# Patient Record
Sex: Female | Born: 1945 | Race: White | Hispanic: No | Marital: Married | Smoking: Never smoker
Health system: Southern US, Community
[De-identification: ages and names within clinical notes are randomized; demographics above are authoritative.]

## PROBLEM LIST (undated history)

## (undated) DIAGNOSIS — Z8719 Personal history of other diseases of the digestive system: Secondary | ICD-10-CM

## (undated) DIAGNOSIS — E7800 Pure hypercholesterolemia, unspecified: Secondary | ICD-10-CM

## (undated) DIAGNOSIS — I10 Essential (primary) hypertension: Secondary | ICD-10-CM

## (undated) DIAGNOSIS — I341 Nonrheumatic mitral (valve) prolapse: Secondary | ICD-10-CM

## (undated) DIAGNOSIS — M797 Fibromyalgia: Secondary | ICD-10-CM

## (undated) DIAGNOSIS — R112 Nausea with vomiting, unspecified: Secondary | ICD-10-CM

## (undated) DIAGNOSIS — G971 Other reaction to spinal and lumbar puncture: Secondary | ICD-10-CM

## (undated) DIAGNOSIS — K219 Gastro-esophageal reflux disease without esophagitis: Secondary | ICD-10-CM

## (undated) DIAGNOSIS — M1990 Unspecified osteoarthritis, unspecified site: Secondary | ICD-10-CM

## (undated) DIAGNOSIS — Z9889 Other specified postprocedural states: Secondary | ICD-10-CM

## (undated) HISTORY — PX: CHOLECYSTECTOMY: SHX55

## (undated) HISTORY — PX: BUNIONECTOMY: SHX129

## (undated) HISTORY — PX: ABDOMINAL HYSTERECTOMY: SHX81

## (undated) HISTORY — PX: TONSILLECTOMY: SUR1361

---

## 2011-10-22 ENCOUNTER — Other Ambulatory Visit: Payer: Self-pay | Admitting: Neurosurgery

## 2011-11-12 ENCOUNTER — Encounter (HOSPITAL_COMMUNITY): Payer: Self-pay | Admitting: Pharmacy Technician

## 2011-11-19 ENCOUNTER — Encounter (HOSPITAL_COMMUNITY): Payer: Self-pay

## 2011-11-19 ENCOUNTER — Encounter (HOSPITAL_COMMUNITY)
Admission: RE | Admit: 2011-11-19 | Discharge: 2011-11-19 | Disposition: A | Discharge status: Home / Self Care | Payer: Medicare Other | Source: Ambulatory Visit | Attending: Neurosurgery | Admitting: Neurosurgery

## 2011-11-19 HISTORY — DX: Other specified postprocedural states: Z98.890

## 2011-11-19 HISTORY — DX: Personal history of other diseases of the digestive system: Z87.19

## 2011-11-19 HISTORY — DX: Nausea with vomiting, unspecified: R11.2

## 2011-11-19 HISTORY — DX: Gastro-esophageal reflux disease without esophagitis: K21.9

## 2011-11-19 HISTORY — DX: Essential (primary) hypertension: I10

## 2011-11-19 HISTORY — DX: Pure hypercholesterolemia, unspecified: E78.00

## 2011-11-19 HISTORY — DX: Unspecified osteoarthritis, unspecified site: M19.90

## 2011-11-19 HISTORY — DX: Fibromyalgia: M79.7

## 2011-11-19 HISTORY — DX: Nonrheumatic mitral (valve) prolapse: I34.1

## 2011-11-19 HISTORY — DX: Other reaction to spinal and lumbar puncture: G97.1

## 2011-11-19 LAB — SURGICAL PCR SCREEN: Staphylococcus aureus: NEGATIVE

## 2011-11-19 LAB — CBC
HCT: 39.1 % (ref 36.0–46.0)
Hemoglobin: 12.9 g/dL (ref 12.0–15.0)
MCV: 92.7 fL (ref 78.0–100.0)
RDW: 13.2 % (ref 11.5–15.5)
WBC: 6.2 10*3/uL (ref 4.0–10.5)

## 2011-11-19 LAB — BASIC METABOLIC PANEL
CO2: 25 mEq/L (ref 19–32)
Chloride: 102 mEq/L (ref 96–112)
Creatinine, Ser: 1.2 mg/dL — ABNORMAL HIGH (ref 0.50–1.10)
Potassium: 3.3 mEq/L — ABNORMAL LOW (ref 3.5–5.1)

## 2011-11-19 MED ORDER — CEFAZOLIN SODIUM-DEXTROSE 2-3 GM-% IV SOLR
2.0000 g | INTRAVENOUS | Status: AC
  Administered 2011-11-20: 2 g via INTRAVENOUS
  Filled 2011-11-19 (×2): qty 50

## 2011-11-19 NOTE — H&P (Signed)
NEUROSURGICAL CONSULTATION   Sherri Graves   DOB:  Dec 22, 1945 #[id number]    October 22, 2011   HISTORY:     Sherri Graves is a 66 year old retired woman with a chief complaint of neck and shoulder pain as well as low back pain. She says her back has been bothering her since April of 2012, but her neck has been bothering her for years.  She describes her neck pain has 8 to 9/10 in severity. She says laying down decreases her pain. She has been to Dr. Neale Burly who did injections to help her with headache and neckache and this has given her some relief for about eight weeks.  Her headaches have resolved both on Topamax and with injections, but her neck pain is absolutely no better.  She has had epidural steroid injections which she said helped her for about a week.  She notes that she is ALLERGIC NARCOTICS AND SAYS THESE WORSEN HER MIGRAINES.  She currently is complaining of neck pain, pain into her shoulders and biceps, and she says both of her arms go numb at night and she describes the pain as constant.    REVIEW OF SYSTEMS:   A detailed Review of Systems sheet was reviewed with the patient.  Pertinent positives include under ears, nose, mouth, and throat - she notes balance disturbance, inability to smell, under cardiovascular - she notes high blood pressure, high cholesterol, swelling in her feet and hands, under musculoskeletal - she notes arm weakness, back pain, joint pain and swelling, rheumatoid arthritis and neck pain, under psychiatric - she notes depression.  All other systems are negative; this includes Constitutional symptoms, Eyes, Endocrine, Respiratory, Gastrointestinal, Genitourinary, Integumentary & Breast, Neurologic, Hematologic/Lymphatic, Allergic/Immunologic.    PAST MEDICAL HISTORY:      Current Medical Conditions:    Her medical problems are extensive and include hypertension, rheumatoid arthritis, hiatal hernia, migraines, mitral valve prolapse, elevated cholesterol,  fibromyalgia and depression.      Prior Operations and Hospitalizations:   Significant for a hysterectomy in 1993, cholecystectomy in 2008, cataract surgery with lens implants in 04/2010 and 05/2010.      Medications and Allergies:  She takes 23 medications - Bystolic 10 mg. qam, Hydrochlorothiazide 12.5 mg. qam, Estradiol 1 mg. qam, Protonix 40 mg. qam, Crestor 10 mg. qpm, Singulair 10 mg. qpm, Methotrexate 2.5 mg. eight per week, Gabapentin 300 mg. two qpm, Viibryd 40 mg. qam, Savella 25 mg. one qam and one qpm, Milnacipran 40 mg. every other week, Dephin (?sp) 15 mg. qam, Trazodone 150 mg. qpm, Topamax 25 mg. three qpm, Baclofen 10 mg. prn, Chlorpromazine 25 mg. prn, Flector patches 1.3% prn, Phenergan 25 mg. prn, Aspirin 81 mg. qpm, Lysine two qd, Calcium 600 + D two qd, Acidophilus 14 mg. qam, and Vitamin D3 two qd.  ALLERGIES TO MEDICATIONS INCLUDE BIAXIN, NARCOTICS, AND NORVASC WHICH CAUSES SWELLING.      Height and Weight:     She is 5'2" tall, 192 lbs.    FAMILY HISTORY:    Both parents are deceased.  Family history of heart attack, stroke, and high blood pressure.    SOCIAL HISTORY:    She currently is a nonsmoker, nondrinker, and no history of substance abuse.    DIAGNOSTIC STUDIES:   We obtained cervical spine radiographs in the office today which show spurring at C5-6 and C6-7 levels with foraminal stenosis at C5-6 and C6-7 levels.  Her C1-2 articulation appears normal with no evidence of malalignment on flexion and extension.  An MRI was performed at [facility] in [place], [place].  This demonstrates at C5-6 there is osteophytes with severe biforaminal stenosis and moderate to severe spinal cord compression with myelopathy and at C6-7 there are osteophytes with severe biforaminal stenosis.    The patient underwent evaluation at the [facility] and said she did not like it and did not want to go back there.    PHYSICAL  EXAMINATION:      General Appearance:   On examination today, [name] is a pleasant and cooperative woman in no acute distress.     Blood Pressure, Pulse:     Her blood pressure is 122/78.  Heart rate is 74 and regular. Respiratory rate is 18.      HEENT - normocephalic, atraumatic.  The pupils are equal, round and reactive to light.  The extraocular muscles are intact.  Sclerae - white.  Conjunctiva - pink.  Oropharynx benign.  Uvula midline.     Neck - there are no masses, meningismus, deformities, tracheal deviation, jugular vein distention or carotid bruits.  There is normal cervical range of motion. She has positive parascapular tenderness bilaterally and a positive Spurlings' maneuver to both sides, with   Lhermitte's sign with axial compression.      Respiratory - there is normal respiratory effort with good intercostal function.  Lungs are clear to auscultation.  There are no rales, rhonchi or wheezes.      Cardiovascular - the heart has regular rate and rhythm to auscultation.  No murmurs are appreciated.  There is no extremity edema, cyanosis or clubbing.  There are palpable pedal pulses.      Abdomen - soft, nontender, no hepatosplenomegaly appreciated or masses.  There are active bowel sounds.  No guarding or rebound.      Musculoskeletal Examination - she has negative Tinel's signs and Phalen's signs at the wrists.  She has some mild tenderness over her anterior shoulder.    NEUROLOGICAL EXAMINATION: The patient is oriented to time, person and place and has good recall of both recent and remote memory with normal attention span and concentration.  The patient speaks with clear and fluent speech and exhibits normal language function and appropriate fund of knowledge.      Cranial Nerve Examination - pupils are equal, round and reactive to light.  Extraocular movements are full.  Visual fields are full to confrontational testing.  Facial sensation and facial movement are  symmetric and intact.  Hearing is intact to finger rub.  Palate is upgoing.  Shoulder shrug is symmetric.  Tongue protrudes in the midline.      Motor Examination - motor strength is 5/5 in the bilateral deltoids, triceps, handgrips, interosseous, 4/5 bilateral biceps and 4/5 bilateral wrist extension.  In the lower extremities motor strength is 5/5 in hip flexion, extension, quadriceps, hamstrings, plantar flexion, dorsiflexion and extensor hallucis longus.      Sensory Examination - normal to light touch and pinprick sensation in the upper and lower extremities.     Deep Tendon Reflexes - 1 in the biceps, 2 in the triceps, 2 in the knees, 2 in the ankles.  The great toes are downgoing to plantar stimulation.      Cerebellar Examination - normal coordination in upper and lower extremities and normal rapid alternating movements.  Romberg test is negative.    IMPRESSION AND RECOMMENDATIONS: Shereen Marton is a 66 year old woman with significant cervical spondylosis and canal stenosis with bilateral upper extremity  weakness. She also has low back pain.  She has a history of rheumatoid arthritis.  I have advised her based on her significant symptoms and degree of canal compromise, and nerve root compression with significant symptomatology that she undergo anterior cervical decompression and fusion at the C5-6 and C6-7 levels. We have set this up for 11/20/2011. She will wear a soft collar after surgery. She wishes to proceed.  She has family in [place]. She comes from [facility].  Risks and benefits were discussed in detail with the patient.  I reviewed her radiographs and met with her and her husband for greater than 45 minutes.  She was fitted for a soft cervical collar by Georgiann Cocker, my Registered Nurse, who also spent time discussing surgery and going over patient education with the patient.    [facility]    Danae Orleans. Venetia Maxon, M.D.  JDS:aft cc: Dr.  Santiago Glad   Dr. Roselie Awkward and Ardell Isaacs, FNP, [facility], [address], [place], [place]  [address]

## 2011-11-19 NOTE — Pre-Procedure Instructions (Signed)
20 Sherri Graves  11/19/2011   Your procedure is scheduled on:  Tuesday July 16 @ 7:30 AM  Report to [facility] at 5:30 AM.  Call this number if you have problems the morning of surgery: [phone]   Remember:   Do not eat food:After Midnight.  May have clear liquids:until Midnight .  Clear liquids include soda, tea, black coffee, apple or grape juice, broth.  Take these medicines the morning of surgery with A SIP OF WATER: Savella, Bystolic, Protonix, Vilazodone.  Baclofen if needed    Do not wear jewelry, make-up or nail polish.  Do not wear lotions, powders, or perfumes. You may wear deodorant.  Do not shave 48 hours prior to surgery. Men may shave face and neck.  Do not bring valuables to the hospital.  Contacts, dentures or bridgework may not be worn into surgery.  Leave suitcase in the car. After surgery it may be brought to your room.  For patients admitted to the hospital, checkout time is 11:00 AM the day of discharge.   Patients discharged the day of surgery will not be allowed to drive home.  Name and phone number of your driver: NA  Special Instructions: CHG Shower Use Special Wash: 1/2 bottle night before surgery and 1/2 bottle morning of surgery.   Please read over the following fact sheets that you were given: Pain Booklet, Coughing and Deep Breathing and Surgical Site Infection Prevention

## 2011-11-19 NOTE — Progress Notes (Signed)
Requested EKG from pt's PCP Dr Wyatt Haste at [facility] in [place], [place].

## 2011-11-20 ENCOUNTER — Encounter (HOSPITAL_COMMUNITY): Payer: Self-pay | Admitting: Certified Registered"

## 2011-11-20 ENCOUNTER — Encounter (HOSPITAL_COMMUNITY)
Admission: RE | Disposition: A | Discharge status: Home / Self Care | Payer: Self-pay | Source: Ambulatory Visit | Attending: Neurosurgery

## 2011-11-20 ENCOUNTER — Inpatient Hospital Stay (HOSPITAL_COMMUNITY): Payer: Medicare Other

## 2011-11-20 ENCOUNTER — Inpatient Hospital Stay (HOSPITAL_COMMUNITY): Payer: Medicare Other | Admitting: Certified Registered"

## 2011-11-20 ENCOUNTER — Inpatient Hospital Stay (HOSPITAL_COMMUNITY)
Admission: RE | Admit: 2011-11-20 | Discharge: 2011-11-21 | DRG: 472 | Disposition: A | Discharge status: Home / Self Care | Payer: Medicare Other | Source: Ambulatory Visit | Attending: Neurosurgery | Admitting: Neurosurgery

## 2011-11-20 ENCOUNTER — Inpatient Hospital Stay (HOSPITAL_COMMUNITY): Admission: RE | Admit: 2011-11-20 | Payer: Medicare Other | Source: Ambulatory Visit | Admitting: Neurosurgery

## 2011-11-20 DIAGNOSIS — M459 Ankylosing spondylitis of unspecified sites in spine: Secondary | ICD-10-CM | POA: Diagnosis present

## 2011-11-20 DIAGNOSIS — M5000 Cervical disc disorder with myelopathy, unspecified cervical region: Principal | ICD-10-CM | POA: Diagnosis present

## 2011-11-20 DIAGNOSIS — Z01812 Encounter for preprocedural laboratory examination: Secondary | ICD-10-CM

## 2011-11-20 DIAGNOSIS — Z01818 Encounter for other preprocedural examination: Secondary | ICD-10-CM

## 2011-11-20 DIAGNOSIS — M4712 Other spondylosis with myelopathy, cervical region: Secondary | ICD-10-CM | POA: Diagnosis present

## 2011-11-20 HISTORY — PX: ANTERIOR CERVICAL DECOMP/DISCECTOMY FUSION: SHX1161

## 2011-11-20 SURGERY — ANTERIOR CERVICAL DECOMPRESSION/DISCECTOMY FUSION 2 LEVELS
Anesthesia: General | Site: Spine Cervical | Wound class: Clean

## 2011-11-20 MED ORDER — DEXAMETHASONE SODIUM PHOSPHATE 4 MG/ML IJ SOLN
INTRAMUSCULAR | Status: DC | PRN
  Administered 2011-11-20: 10 mg via INTRAVENOUS

## 2011-11-20 MED ORDER — ATORVASTATIN CALCIUM 10 MG PO TABS
10.0000 mg | ORAL_TABLET | Freq: Every day | ORAL | Status: DC
  Administered 2011-11-20: 10 mg via ORAL
  Filled 2011-11-20 (×2): qty 1

## 2011-11-20 MED ORDER — SODIUM CHLORIDE 0.9 % IV SOLN: 250.0000 mL | INTRAVENOUS | Status: DC

## 2011-11-20 MED ORDER — SODIUM CHLORIDE 0.9 % IJ SOLN: 3.0000 mL | INTRAMUSCULAR | Status: DC | PRN

## 2011-11-20 MED ORDER — TOPIRAMATE 25 MG PO TABS
75.0000 mg | ORAL_TABLET | Freq: Every day | ORAL | Status: DC
  Administered 2011-11-20: 75 mg via ORAL
  Filled 2011-11-20 (×2): qty 3

## 2011-11-20 MED ORDER — PANTOPRAZOLE SODIUM 40 MG PO TBEC: 40.0000 mg | DELAYED_RELEASE_TABLET | Freq: Every morning | ORAL | Status: DC

## 2011-11-20 MED ORDER — GLYCOPYRROLATE 0.2 MG/ML IJ SOLN
INTRAMUSCULAR | Status: DC | PRN
  Administered 2011-11-20: .5 mg via INTRAVENOUS

## 2011-11-20 MED ORDER — METHOTREXATE 2.5 MG PO TABS: 20.0000 mg | ORAL_TABLET | ORAL | Status: DC

## 2011-11-20 MED ORDER — VITAMIN D3 25 MCG (1000 UNIT) PO TABS
2000.0000 [IU] | ORAL_TABLET | Freq: Two times a day (BID) | ORAL | Status: DC
  Administered 2011-11-20: 2000 [IU] via ORAL
  Filled 2011-11-20 (×3): qty 2

## 2011-11-20 MED ORDER — PANTOPRAZOLE SODIUM 40 MG IV SOLR: 40.0000 mg | Freq: Every day | INTRAVENOUS | Status: DC

## 2011-11-20 MED ORDER — ONDANSETRON HCL 4 MG/2ML IJ SOLN
INTRAMUSCULAR | Status: DC | PRN
  Administered 2011-11-20: 4 mg via INTRAVENOUS

## 2011-11-20 MED ORDER — ONDANSETRON HCL 4 MG/2ML IJ SOLN: 4.0000 mg | Freq: Once | INTRAMUSCULAR | Status: DC | PRN

## 2011-11-20 MED ORDER — FLORA-Q PO CAPS
1.0000 | ORAL_CAPSULE | Freq: Every day | ORAL | Status: DC
  Filled 2011-11-20 (×2): qty 1

## 2011-11-20 MED ORDER — MENTHOL 3 MG MT LOZG: 1.0000 | LOZENGE | OROMUCOSAL | Status: DC | PRN

## 2011-11-20 MED ORDER — PHENOL 1.4 % MT LIQD: 1.0000 | OROMUCOSAL | Status: DC | PRN

## 2011-11-20 MED ORDER — NEOSTIGMINE METHYLSULFATE 1 MG/ML IJ SOLN
INTRAMUSCULAR | Status: DC | PRN
  Administered 2011-11-20: 3 mg via INTRAVENOUS

## 2011-11-20 MED ORDER — ACETAMINOPHEN 650 MG RE SUPP: 650.0000 mg | RECTAL | Status: DC | PRN

## 2011-11-20 MED ORDER — MIDAZOLAM HCL 5 MG/5ML IJ SOLN
INTRAMUSCULAR | Status: DC | PRN
  Administered 2011-11-20: 2 mg via INTRAVENOUS

## 2011-11-20 MED ORDER — VITAMIN D 50 MCG (2000 UT) PO TABS: 2000.0000 [IU] | ORAL_TABLET | Freq: Two times a day (BID) | ORAL | Status: DC

## 2011-11-20 MED ORDER — ACIDOPHILUS PO CAPS: 1.0000 | ORAL_CAPSULE | Freq: Every day | ORAL | Status: DC

## 2011-11-20 MED ORDER — ACETAMINOPHEN 10 MG/ML IV SOLN
INTRAVENOUS | Status: AC
  Filled 2011-11-20: qty 100

## 2011-11-20 MED ORDER — DIAZEPAM 5 MG PO TABS
5.0000 mg | ORAL_TABLET | Freq: Four times a day (QID) | ORAL | Status: DC | PRN
  Administered 2011-11-20 – 2011-11-21 (×3): 5 mg via ORAL
  Filled 2011-11-20 (×3): qty 1

## 2011-11-20 MED ORDER — ACETAMINOPHEN 10 MG/ML IV SOLN
1000.0000 mg | Freq: Once | INTRAVENOUS | Status: AC | PRN
  Administered 2011-11-20: 1000 mg via INTRAVENOUS

## 2011-11-20 MED ORDER — 0.9 % SODIUM CHLORIDE (POUR BTL) OPTIME
TOPICAL | Status: DC | PRN
  Administered 2011-11-20: 1000 mL

## 2011-11-20 MED ORDER — HYDROCHLOROTHIAZIDE 12.5 MG PO CAPS
12.5000 mg | ORAL_CAPSULE | Freq: Every morning | ORAL | Status: DC
  Filled 2011-11-20 (×2): qty 1

## 2011-11-20 MED ORDER — BACITRACIN 50000 UNITS IM SOLR
INTRAMUSCULAR | Status: AC
  Filled 2011-11-20: qty 1

## 2011-11-20 MED ORDER — PROPOFOL 10 MG/ML IV EMUL
INTRAVENOUS | Status: DC | PRN
  Administered 2011-11-20: 120 mg via INTRAVENOUS

## 2011-11-20 MED ORDER — SODIUM CHLORIDE 0.9 % IV SOLN
INTRAVENOUS | Status: AC
  Filled 2011-11-20: qty 500

## 2011-11-20 MED ORDER — MILNACIPRAN HCL 25 MG PO TABS: 25.0000 mg | ORAL_TABLET | Freq: Two times a day (BID) | ORAL | Status: DC

## 2011-11-20 MED ORDER — KCL IN DEXTROSE-NACL 20-5-0.45 MEQ/L-%-% IV SOLN
INTRAVENOUS | Status: DC
  Filled 2011-11-20 (×3): qty 1000

## 2011-11-20 MED ORDER — ALUM & MAG HYDROXIDE-SIMETH 200-200-20 MG/5ML PO SUSP: 30.0000 mL | Freq: Four times a day (QID) | ORAL | Status: DC | PRN

## 2011-11-20 MED ORDER — ONDANSETRON HCL 4 MG/2ML IJ SOLN: 4.0000 mg | INTRAMUSCULAR | Status: DC | PRN

## 2011-11-20 MED ORDER — FOLIC ACID 1 MG PO TABS
1.0000 mg | ORAL_TABLET | Freq: Every day | ORAL | Status: DC
  Filled 2011-11-20 (×2): qty 1

## 2011-11-20 MED ORDER — ASPIRIN EC 81 MG PO TBEC
81.0000 mg | DELAYED_RELEASE_TABLET | Freq: Every day | ORAL | Status: DC
  Administered 2011-11-20: 81 mg via ORAL
  Filled 2011-11-20 (×2): qty 1

## 2011-11-20 MED ORDER — LIDOCAINE HCL (CARDIAC) 20 MG/ML IV SOLN
INTRAVENOUS | Status: DC | PRN
  Administered 2011-11-20: 40 mg via INTRAVENOUS

## 2011-11-20 MED ORDER — ESTRADIOL 1 MG PO TABS
1.0000 mg | ORAL_TABLET | Freq: Every day | ORAL | Status: DC
  Filled 2011-11-20 (×2): qty 1

## 2011-11-20 MED ORDER — SODIUM CHLORIDE 0.9 % IJ SOLN: 3.0000 mL | Freq: Two times a day (BID) | INTRAMUSCULAR | Status: DC

## 2011-11-20 MED ORDER — MONTELUKAST SODIUM 10 MG PO TABS
10.0000 mg | ORAL_TABLET | Freq: Every day | ORAL | Status: DC
  Administered 2011-11-20: 10 mg via ORAL
  Filled 2011-11-20 (×2): qty 1

## 2011-11-20 MED ORDER — LIDOCAINE-EPINEPHRINE 1 %-1:100000 IJ SOLN
INTRAMUSCULAR | Status: DC | PRN
  Administered 2011-11-20: 10 mL

## 2011-11-20 MED ORDER — CEFAZOLIN SODIUM 1-5 GM-% IV SOLN
1.0000 g | Freq: Three times a day (TID) | INTRAVENOUS | Status: AC
  Administered 2011-11-21: 1 g via INTRAVENOUS
  Filled 2011-11-20 (×2): qty 50

## 2011-11-20 MED ORDER — SODIUM CHLORIDE 0.9 % IR SOLN
Status: DC | PRN
  Administered 2011-11-20: 08:00:00

## 2011-11-20 MED ORDER — ROCURONIUM BROMIDE 100 MG/10ML IV SOLN
INTRAVENOUS | Status: DC | PRN
  Administered 2011-11-20: 50 mg via INTRAVENOUS

## 2011-11-20 MED ORDER — OXYCODONE HCL 5 MG PO TABS: 10.0000 mg | ORAL_TABLET | ORAL | Status: DC | PRN

## 2011-11-20 MED ORDER — DEPLIN 15 MG PO TABS: 15.0000 mg | ORAL_TABLET | Freq: Every morning | ORAL | Status: DC

## 2011-11-20 MED ORDER — THROMBIN 5000 UNITS EX KIT
PACK | CUTANEOUS | Status: DC | PRN
  Administered 2011-11-20 (×2): 5000 [IU] via TOPICAL

## 2011-11-20 MED ORDER — HEMOSTATIC AGENTS (NO CHARGE) OPTIME
TOPICAL | Status: DC | PRN
  Administered 2011-11-20: 1 via TOPICAL

## 2011-11-20 MED ORDER — PANTOPRAZOLE SODIUM 40 MG PO TBEC
40.0000 mg | DELAYED_RELEASE_TABLET | Freq: Every day | ORAL | Status: DC
Start: 2011-11-20 — End: 2011-11-21

## 2011-11-20 MED ORDER — ACETAMINOPHEN 10 MG/ML IV SOLN
1000.0000 mg | Freq: Four times a day (QID) | INTRAVENOUS | Status: DC
  Administered 2011-11-21 (×2): 1000 mg via INTRAVENOUS
  Filled 2011-11-20 (×4): qty 100

## 2011-11-20 MED ORDER — BUPIVACAINE HCL (PF) 0.5 % IJ SOLN
INTRAMUSCULAR | Status: DC | PRN
  Administered 2011-11-20: 10 mL

## 2011-11-20 MED ORDER — TRAZODONE HCL 150 MG PO TABS
150.0000 mg | ORAL_TABLET | Freq: Every day | ORAL | Status: DC
  Administered 2011-11-20: 150 mg via ORAL
  Filled 2011-11-20 (×2): qty 1

## 2011-11-20 MED ORDER — ACETAMINOPHEN 325 MG PO TABS: 650.0000 mg | ORAL_TABLET | ORAL | Status: DC | PRN

## 2011-11-20 MED ORDER — VILAZODONE HCL 40 MG PO TABS
40.0000 mg | ORAL_TABLET | Freq: Every morning | ORAL | Status: DC
  Filled 2011-11-20 (×2): qty 1

## 2011-11-20 MED ORDER — HYDROMORPHONE HCL PF 1 MG/ML IJ SOLN: 0.5000 mg | INTRAMUSCULAR | Status: DC | PRN

## 2011-11-20 MED ORDER — NEBIVOLOL HCL 10 MG PO TABS
10.0000 mg | ORAL_TABLET | Freq: Every day | ORAL | Status: DC
  Filled 2011-11-20 (×2): qty 1

## 2011-11-20 MED ORDER — SUFENTANIL CITRATE 50 MCG/ML IV SOLN
INTRAVENOUS | Status: DC | PRN
  Administered 2011-11-20: 20 ug via INTRAVENOUS
  Administered 2011-11-20: 10 ug via INTRAVENOUS

## 2011-11-20 MED ORDER — DOCUSATE SODIUM 100 MG PO CAPS
100.0000 mg | ORAL_CAPSULE | Freq: Two times a day (BID) | ORAL | Status: DC
  Administered 2011-11-20: 100 mg via ORAL
  Filled 2011-11-20: qty 1

## 2011-11-20 MED ORDER — LACTATED RINGERS IV SOLN
INTRAVENOUS | Status: DC | PRN
  Administered 2011-11-20 (×2): via INTRAVENOUS

## 2011-11-20 MED ORDER — LYSINE 500 MG PO CAPS: 500.0000 mg | ORAL_CAPSULE | Freq: Two times a day (BID) | ORAL | Status: DC

## 2011-11-20 MED ORDER — BACLOFEN 10 MG PO TABS
10.0000 mg | ORAL_TABLET | Freq: Three times a day (TID) | ORAL | Status: DC | PRN
  Administered 2011-11-20: 10 mg via ORAL
  Filled 2011-11-20: qty 1

## 2011-11-20 MED ORDER — TRAMADOL HCL 50 MG PO TABS
50.0000 mg | ORAL_TABLET | Freq: Four times a day (QID) | ORAL | Status: DC | PRN
  Filled 2011-11-20: qty 1

## 2011-11-20 MED ORDER — HYDROMORPHONE HCL PF 1 MG/ML IJ SOLN: 0.2500 mg | INTRAMUSCULAR | Status: DC | PRN

## 2011-11-20 MED ORDER — GABAPENTIN 300 MG PO CAPS
600.0000 mg | ORAL_CAPSULE | Freq: Every day | ORAL | Status: DC
  Administered 2011-11-20: 600 mg via ORAL
  Filled 2011-11-20 (×2): qty 2

## 2011-11-20 SURGICAL SUPPLY — 68 items
BAG DECANTER FOR FLEXI CONT (MISCELLANEOUS) ×2 IMPLANT
BANDAGE GAUZE ELAST BULKY 4 IN (GAUZE/BANDAGES/DRESSINGS) IMPLANT
BENZOIN TINCTURE PRP APPL 2/3 (GAUZE/BANDAGES/DRESSINGS) IMPLANT
BIT DRILL NEURO 2X3.1 SFT TUCH (MISCELLANEOUS) ×1 IMPLANT
BLADE ULTRA TIP 2M (BLADE) ×2 IMPLANT
BLOCK PROFUSE SZ 6 (Bone Implant) ×2 IMPLANT
BUR BARREL STRAIGHT FLUTE 4.0 (BURR) ×2 IMPLANT
CAGE CERVICAL 7 (Cage) ×2 IMPLANT
CANISTER SUCTION 2500CC (MISCELLANEOUS) ×2 IMPLANT
CLOTH BEACON ORANGE TIMEOUT ST (SAFETY) ×2 IMPLANT
CONT SPEC 4OZ CLIKSEAL STRL BL (MISCELLANEOUS) ×2 IMPLANT
COVER MAYO STAND STRL (DRAPES) ×2 IMPLANT
DERMABOND ADVANCED (GAUZE/BANDAGES/DRESSINGS) ×1
DERMABOND ADVANCED .7 DNX12 (GAUZE/BANDAGES/DRESSINGS) ×1 IMPLANT
DRAPE LAPAROTOMY 100X72 PEDS (DRAPES) ×2 IMPLANT
DRAPE MICROSCOPE LEICA (MISCELLANEOUS) IMPLANT
DRAPE POUCH INSTRU U-SHP 10X18 (DRAPES) ×2 IMPLANT
DRAPE PROXIMA HALF (DRAPES) IMPLANT
DRESSING TELFA 8X3 (GAUZE/BANDAGES/DRESSINGS) IMPLANT
DRILL NEURO 2X3.1 SOFT TOUCH (MISCELLANEOUS) ×2
DURAPREP 6ML APPLICATOR 50/CS (WOUND CARE) ×2 IMPLANT
ELECT COATED BLADE 2.86 ST (ELECTRODE) ×2 IMPLANT
ELECT REM PT RETURN 9FT ADLT (ELECTROSURGICAL) ×2
ELECTRODE REM PT RTRN 9FT ADLT (ELECTROSURGICAL) ×1 IMPLANT
GAUZE SPONGE 4X4 16PLY XRAY LF (GAUZE/BANDAGES/DRESSINGS) IMPLANT
GLOVE BIO SURGEON STRL SZ8 (GLOVE) ×2 IMPLANT
GLOVE BIOGEL M 8.0 STRL (GLOVE) ×2 IMPLANT
GLOVE BIOGEL PI IND STRL 8 (GLOVE) ×1 IMPLANT
GLOVE BIOGEL PI IND STRL 8.5 (GLOVE) ×1 IMPLANT
GLOVE BIOGEL PI INDICATOR 8 (GLOVE) ×1
GLOVE BIOGEL PI INDICATOR 8.5 (GLOVE) ×1
GLOVE ECLIPSE 8.0 STRL XLNG CF (GLOVE) ×2 IMPLANT
GLOVE EXAM NITRILE LRG STRL (GLOVE) IMPLANT
GLOVE EXAM NITRILE MD LF STRL (GLOVE) IMPLANT
GLOVE EXAM NITRILE XL STR (GLOVE) IMPLANT
GLOVE EXAM NITRILE XS STR PU (GLOVE) IMPLANT
GLOVE INDICATOR 7.0 STRL GRN (GLOVE) ×2 IMPLANT
GLOVE SURG SS PI 7.0 STRL IVOR (GLOVE) ×4 IMPLANT
GOWN BRE IMP SLV AUR LG STRL (GOWN DISPOSABLE) ×2 IMPLANT
GOWN BRE IMP SLV AUR XL STRL (GOWN DISPOSABLE) ×4 IMPLANT
GOWN STRL REIN 2XL LVL4 (GOWN DISPOSABLE) ×2 IMPLANT
HEAD HALTER (SOFTGOODS) ×2 IMPLANT
KIT BASIN OR (CUSTOM PROCEDURE TRAY) ×2 IMPLANT
KIT ROOM TURNOVER OR (KITS) ×2 IMPLANT
NEEDLE HYPO 18GX1.5 BLUNT FILL (NEEDLE) ×2 IMPLANT
NEEDLE HYPO 25X1 1.5 SAFETY (NEEDLE) ×2 IMPLANT
NEEDLE SPNL 22GX3.5 QUINCKE BK (NEEDLE) ×2 IMPLANT
NS IRRIG 1000ML POUR BTL (IV SOLUTION) ×2 IMPLANT
PACK LAMINECTOMY NEURO (CUSTOM PROCEDURE TRAY) ×2 IMPLANT
PAD ARMBOARD 7.5X6 YLW CONV (MISCELLANEOUS) ×2 IMPLANT
PIN DISTRACTION 14MM (PIN) ×4 IMPLANT
PLATE 30MM (Plate) ×2 IMPLANT
RUBBERBAND STERILE (MISCELLANEOUS) IMPLANT
SCREW 12MM (Screw) ×12 IMPLANT
SPACER SPNL 7D LRG 16X14X7XNS (Cage) ×2 IMPLANT
SPCR SPNL 7D LRG 16X14X7XNS (Cage) ×2 IMPLANT
SPONGE GAUZE 4X4 12PLY (GAUZE/BANDAGES/DRESSINGS) IMPLANT
SPONGE INTESTINAL PEANUT (DISPOSABLE) ×2 IMPLANT
SPONGE SURGIFOAM ABS GEL SZ50 (HEMOSTASIS) ×2 IMPLANT
STAPLER SKIN PROX WIDE 3.9 (STAPLE) IMPLANT
STRIP CLOSURE SKIN 1/2X4 (GAUZE/BANDAGES/DRESSINGS) IMPLANT
SUT VIC AB 3-0 SH 8-18 (SUTURE) ×4 IMPLANT
SYR 20ML ECCENTRIC (SYRINGE) ×2 IMPLANT
SYR 3ML LL SCALE MARK (SYRINGE) ×2 IMPLANT
TOWEL OR 17X24 6PK STRL BLUE (TOWEL DISPOSABLE) ×2 IMPLANT
TOWEL OR 17X26 10 PK STRL BLUE (TOWEL DISPOSABLE) ×2 IMPLANT
TRAP SPECIMEN MUCOUS 40CC (MISCELLANEOUS) ×2 IMPLANT
WATER STERILE IRR 1000ML POUR (IV SOLUTION) ×2 IMPLANT

## 2011-11-20 NOTE — Anesthesia Postprocedure Evaluation (Signed)
  Anesthesia Post-op Note  Patient: Sherri Graves  Procedure(s) Performed: Procedure(s) (LRB): ANTERIOR CERVICAL DECOMPRESSION/DISCECTOMY FUSION 2 LEVELS (N/A)  Patient Location: PACU  Anesthesia Type: General  Level of Consciousness: awake, alert  and oriented  Airway and Oxygen Therapy: Patient Spontanous Breathing  Post-op Pain: mild  Post-op Assessment: Post-op Vital signs reviewed and Patient's Cardiovascular Status Stable  Post-op Vital Signs: stable  Complications: No apparent anesthesia complications

## 2011-11-20 NOTE — Interval H&P Note (Signed)
History and Physical Interval Note:  11/20/2011 6:44 AM  Sherri Graves  has presented today for surgery, with the diagnosis of Cervical hnp and spondylosis without myelopathy  The various methods of treatment have been discussed with the patient and family. After consideration of risks, benefits and other options for treatment, the patient has consented to  Procedure(s) (LRB): ANTERIOR CERVICAL DECOMPRESSION/DISCECTOMY FUSION 2 LEVELS (N/A) as a surgical intervention .  The patient's history has been reviewed, patient examined, no change in status, stable for surgery.  I have reviewed the patients' chart and labs.  Questions were answered to the patient's satisfaction.     Enmanuel Zufall D  Date of Initial H&P: 11/19/2011  History reviewed, patient examined, no change in status, stable for surgery.

## 2011-11-20 NOTE — Transfer of Care (Signed)
Immediate Anesthesia Transfer of Care Note  Patient: Sherri Graves  Procedure(s) Performed: Procedure(s) (LRB): ANTERIOR CERVICAL DECOMPRESSION/DISCECTOMY FUSION 2 LEVELS (N/A)  Patient Location: PACU  Anesthesia Type: General  Level of Consciousness: sedated  Airway & Oxygen Therapy: Patient Spontanous Breathing and Patient connected to nasal cannula oxygen  Post-op Assessment: Report given to PACU RN, Post -op Vital signs reviewed and stable and Patient moving all extremities  Post vital signs: Reviewed and stable  Complications: No apparent anesthesia complications

## 2011-11-20 NOTE — Preoperative (Signed)
Beta Blockers   Reason not to administer Beta Blockers:Bystolic 11/20/11 at 0415hrs

## 2011-11-20 NOTE — Op Note (Signed)
11/20/2011  10:01 AM  PATIENT:  Sherri Graves  66 y.o. female  PRE-OPERATIVE DIAGNOSIS:  Cervical hnp with myelopathy, cervical stenosis, and spondylosis with myelopathy, Rheumatoid arthritis C56 and C67  POST-OPERATIVE DIAGNOSIS: Cervical hnp with myelopathy, cervical stenosis, and spondylosis with myelopathy, Rheumatoid arthritis C56 and C67 PROCEDURE:  Procedure(s) (LRB): ANTERIOR CERVICAL DECOMPRESSION/DISCECTOMY FUSION 2 LEVELS C56 and C67 with PEEK cages, autograft, allograft, plate (N/A)  SURGEON:  Surgeon(s) and Role:    * Maeola Harman, MD - Primary    * Karn Cassis, MD - Assisting  PHYSICIAN ASSISTANT:   ASSISTANTS: Poteat, RN   ANESTHESIA:   general  EBL:  Total I/O In: 1000 [I.V.:1000] Out: -   BLOOD ADMINISTERED:none  DRAINS: none   LOCAL MEDICATIONS USED:  LIDOCAINE   SPECIMEN:  No Specimen  DISPOSITION OF SPECIMEN:  N/A  COUNTS:  YES  TOURNIQUET:  * No tourniquets in log *  DICTATION: DICTATION: Patient is 66 year old female with bilateral arm pain and weakness with HNP, spondylosis, myelopathy C5/6 and C6/7  PROCEDURE: Patient was brought to operating room and following the smooth and uncomplicated induction of general endotracheal anesthesia her head was placed on a horseshoe head holder she was placed in 5 pounds of Holter traction and her anterior neck was prepped and draped in usual sterile fashion. An incision was made on the left side of midline after infiltrating the skin and subcutaneous tissues with local lidocaine. The platysmal layer was incised and subplatysmal dissection was performed exposing the anterior border sternocleidomastoid muscle. Using blunt dissection the carotid sheath was kept lateral and trachea and esophagus kept medial exposing the anterior cervical spine. A bent spinal needle was placed it was felt to be the C4/5 and C5/6 levels and this was confirmed on intraoperative x-ray. Longus coli muscles were taken down from  the anterior cervical spine using electrocautery and key elevator and self-retaining retractor was placed exposing the C5/6 and C6/7 levels. The interspaces were incised and a thorough discectomy was performed. Distraction pins were placed. Initially the C5/6 level was operated. Uncinate spurs and central spondylitic ridges were drilled down with a high-speed drill. The spinal cord dura and both C6 nerve roots were widely decompressed. The posterior longitudinal ligament was adherent to the dura and calcified.  This was carefully elevated and removed, decompressing the spinal cord dura.  Hemostasis was assured. After trial sizing a 7 mm peek interbody cage was selected and packed with profuse block and autograft. This was tamped into position and countersunk appropriately. Attention was the paid to the C6/7 level, where similar decompression was performed.  Uncinate spurs and central spondylitic ridges were drilled down with a high-speed drill. The spinal cord dura and both C7 nerve roots were widely decompressed. Hemostasis was assured. After trial sizing a 7 mm peek interbody cage was selected and packed with profuse block and autograft. This was tamped into position and countersunk appropriately. Distraction weight was removed. A 30 mm trestle luxe anterior cervical plate was affixed to the cervical spine with 12 mm variable-angle screws 2 at C5, 2 at C6 and 2 at C7. All screws were well-positioned and locking mechanisms were engaged. A final X ray was obtained which showed well positioned uppermost graft and anterior plate without complicating features. Soft tissues were inspected and found to be in good repair. The wound was irrigated. The platysma layer was closed with 3-0 Vicryl stitches and the skin was reapproximated with 3-0 Vicryl subcuticular stitches. The wound was  dressed with Dermabond. Counts were correct at the end of the case. Patient was extubated and taken to recovery in stable and satisfactory  condition.   PLAN OF CARE: Admit for overnight observation  PATIENT DISPOSITION:  PACU - hemodynamically stable.   Delay start of Pharmacological VTE agent (>24hrs) due to surgical blood loss or risk of bleeding: yes

## 2011-11-20 NOTE — Anesthesia Preprocedure Evaluation (Signed)
Anesthesia Evaluation  Patient identified by MRN, date of birth, ID band Patient awake    Reviewed: Allergy & Precautions, H&P , NPO status , Patient's Chart, lab work & pertinent test results  Airway Mallampati: II TM Distance: >3 FB     Dental  (+) Teeth Intact   Pulmonary  breath sounds clear to auscultation        Cardiovascular Rhythm:Regular Rate:Normal     Neuro/Psych    GI/Hepatic   Endo/Other    Renal/GU      Musculoskeletal   Abdominal   Peds  Hematology   Anesthesia Other Findings   Reproductive/Obstetrics                           Anesthesia Physical Anesthesia Plan  ASA: III  Anesthesia Plan: General   Post-op Pain Management:    Induction: Intravenous  Airway Management Planned: Oral ETT  Additional Equipment:   Intra-op Plan:   Post-operative Plan: Extubation in OR  Informed Consent: I have reviewed the patients History and Physical, chart, labs and discussed the procedure including the risks, benefits and alternatives for the proposed anesthesia with the patient or authorized representative who has indicated his/her understanding and acceptance.   Dental advisory given  Plan Discussed with:   Anesthesia Plan Comments: (Cervical spondylosis htn Rheumatoid arthritis  Obesity Migraines Fibromyalgia  Plan GA  Kipp Brood, MD)        Anesthesia Quick Evaluation

## 2011-11-20 NOTE — Progress Notes (Signed)
Patient ID: Sherri Graves, female   DOB: 10-18-1945, 66 y.o.   MRN: [id number]  Sleeping, awakens to voice. Husband present. Incision with Dermabond. No erythema, swelling, or drainage. Reviewed d/c instructions with husband in preparation for d/c to home in am.  Georgiann Cocker, RN, BSN

## 2011-11-20 NOTE — Progress Notes (Signed)
Patient awake, alert, conversant.  MAEW.  Doing well. Full bilateral biceps and triceps strength.

## 2011-11-21 ENCOUNTER — Encounter (HOSPITAL_COMMUNITY): Payer: Self-pay | Admitting: Neurosurgery

## 2011-11-21 NOTE — Discharge Summary (Signed)
Physician Discharge Summary  Patient ID: Sherri Graves MRN: [id number] DOB/AGE: [birth year] 66 y.o.  Admit date: 11/20/2011 Discharge date: 11/21/2011  Admission Diagnoses: Cervical hnp with myelopathy, cervical stenosis, and spondylosis with myelopathy, Rheumatoid arthritis C56 and C67   Discharge Diagnoses: Cervical hnp with myelopathy, cervical stenosis, and spondylosis with myelopathy, Rheumatoid arthritis C56 and C67 s/p ANTERIOR CERVICAL DECOMPRESSION/DISCECTOMY FUSION 2 LEVELS C56 and C67 with PEEK cages, autograft, allograft, plate   Active Problems:  * No active hospital problems. *    Discharged Condition: good  Hospital Course: Brandan Glauber was admitted for surgery on 11/20/11 with dx of Cervical hnp with myelopathy, cervical stenosis, and spondylosis with myelopathy, Rheumatoid arthritis C56 and C67. Following uncomplicated ACDF, she recovered nicely in Neuro PACU before transferring to 3500 for overnight observation.    Consults: None  Significant Diagnostic Studies: radiology: X-Ray: intra-operative  Treatments: surgery: ANTERIOR CERVICAL DECOMPRESSION/DISCECTOMY FUSION 2 LEVELS C56 and C67 with PEEK cages, autograft, allograft, plate    Discharge Exam: Blood pressure 133/67, pulse 77, temperature 97.6 F (36.4 C), temperature source Axillary, resp. rate 16, SpO2 95.00%. Husband present. Good strength BUE. No arm pain or neck pain. Mild soreness between scapulae - reassured.  Incision with Dermabond. No erythema, swelling, or drainage. Reviewed d/c instructions   Disposition: D/C to home per Dr. Venetia Maxon. Rx given: Valium 5mg  1po q6hrs prn spasm #40. Vicodin 5/325 1-2 po q4hrs prn pain #60.   Medication List  As of 11/21/2011  8:32 AM   ASK your doctor about these medications         ACIDOPHILUS PO   Take 1 capsule by mouth every morning.      aspirin EC 81 MG tablet   Take 81 mg by mouth at bedtime.      baclofen 10 MG tablet   Commonly known as:  LIORESAL   Take 10 mg by mouth 3 (three) times daily as needed. For migraine      CALCIUM 600 + D PO   Take 2 tablets by mouth daily.      DEPLIN 15 MG Tabs   Take 15 mg by mouth every morning.      diclofenac 1.3 % Ptch   Commonly known as: FLECTOR   Place 1 patch onto the skin daily as needed. For pain      estradiol 1 MG tablet   Commonly known as: ESTRACE   Take 1 mg by mouth daily.      gabapentin 300 MG capsule   Commonly known as: NEURONTIN   Take 600 mg by mouth at bedtime.      HUMIRA 40 MG/0.8ML injection   Generic drug: adalimumab   Inject 40 mg into the skin every 14 (fourteen) days. Fridays      hydrochlorothiazide 12.5 MG capsule   Commonly known as: MICROZIDE   Take 12.5 mg by mouth every morning.      Lysine 500 MG Caps   Take 500 mg by mouth 2 (two) times daily.      methotrexate 2.5 MG tablet   Commonly known as: RHEUMATREX   Take 20 mg by mouth once a week. Tuesdays. Caution:Chemotherapy. Protect from light.      montelukast 10 MG tablet   Commonly known as: SINGULAIR   Take 10 mg by mouth at bedtime.      nebivolol 10 MG tablet   Commonly known as: BYSTOLIC   Take 10 mg by mouth daily.      pantoprazole 40 MG  tablet   Commonly known as: PROTONIX   Take 40 mg by mouth every morning.      rosuvastatin 10 MG tablet   Commonly known as: CRESTOR   Take 10 mg by mouth every morning.      SAVELLA 25 MG Tabs   Generic drug: Milnacipran HCl   Take 25 mg by mouth 2 (two) times daily.      topiramate 25 MG tablet   Commonly known as: TOPAMAX   Take 75 mg by mouth at bedtime.      traZODone 150 MG tablet   Commonly known as: DESYREL   Take 150 mg by mouth at bedtime.      VIIBRYD 40 MG Tabs   Generic drug: Vilazodone HCl   Take 40 mg by mouth every morning.      Vitamin D 2000 UNITS tablet   Take 2,000 Units by mouth 2 (two) times daily.             Signed: Georgiann Cocker 11/21/2011, 8:32 AM

## 2011-11-21 NOTE — Progress Notes (Signed)
Patient doing well.  Discharge home. 

## 2011-11-21 NOTE — Progress Notes (Signed)
Patient referred for SNF placement apparently in error by MD.  Discussed with patient's nurse- Patient was d/c'd home today with no needs identified.  Social worker signing off.  Lorri Frederick. [place]  [phone]

## 2011-11-21 NOTE — Progress Notes (Signed)
UR COMPLETED  

## 2011-11-22 NOTE — Discharge Summary (Signed)
Agree with above 

## 2011-12-12 ENCOUNTER — Other Ambulatory Visit: Payer: Self-pay | Admitting: Neurosurgery

## 2011-12-12 ENCOUNTER — Ambulatory Visit
Admission: RE | Admit: 2011-12-12 | Discharge: 2011-12-12 | Disposition: A | Discharge status: Home / Self Care | Payer: Medicare Other | Source: Ambulatory Visit | Attending: Neurosurgery | Admitting: Neurosurgery

## 2011-12-12 DIAGNOSIS — M25511 Pain in right shoulder: Secondary | ICD-10-CM

## 2013-07-01 IMAGING — CR DG CHEST 2V
2 series · 2 of 2 positions shown · non-contrast
Comparison: None.

CLINICAL DATA: Preoperative respiratory exam.  Cervical disc
disease.

CHEST - 2 VIEW

[view not recorded (1 of 2)]
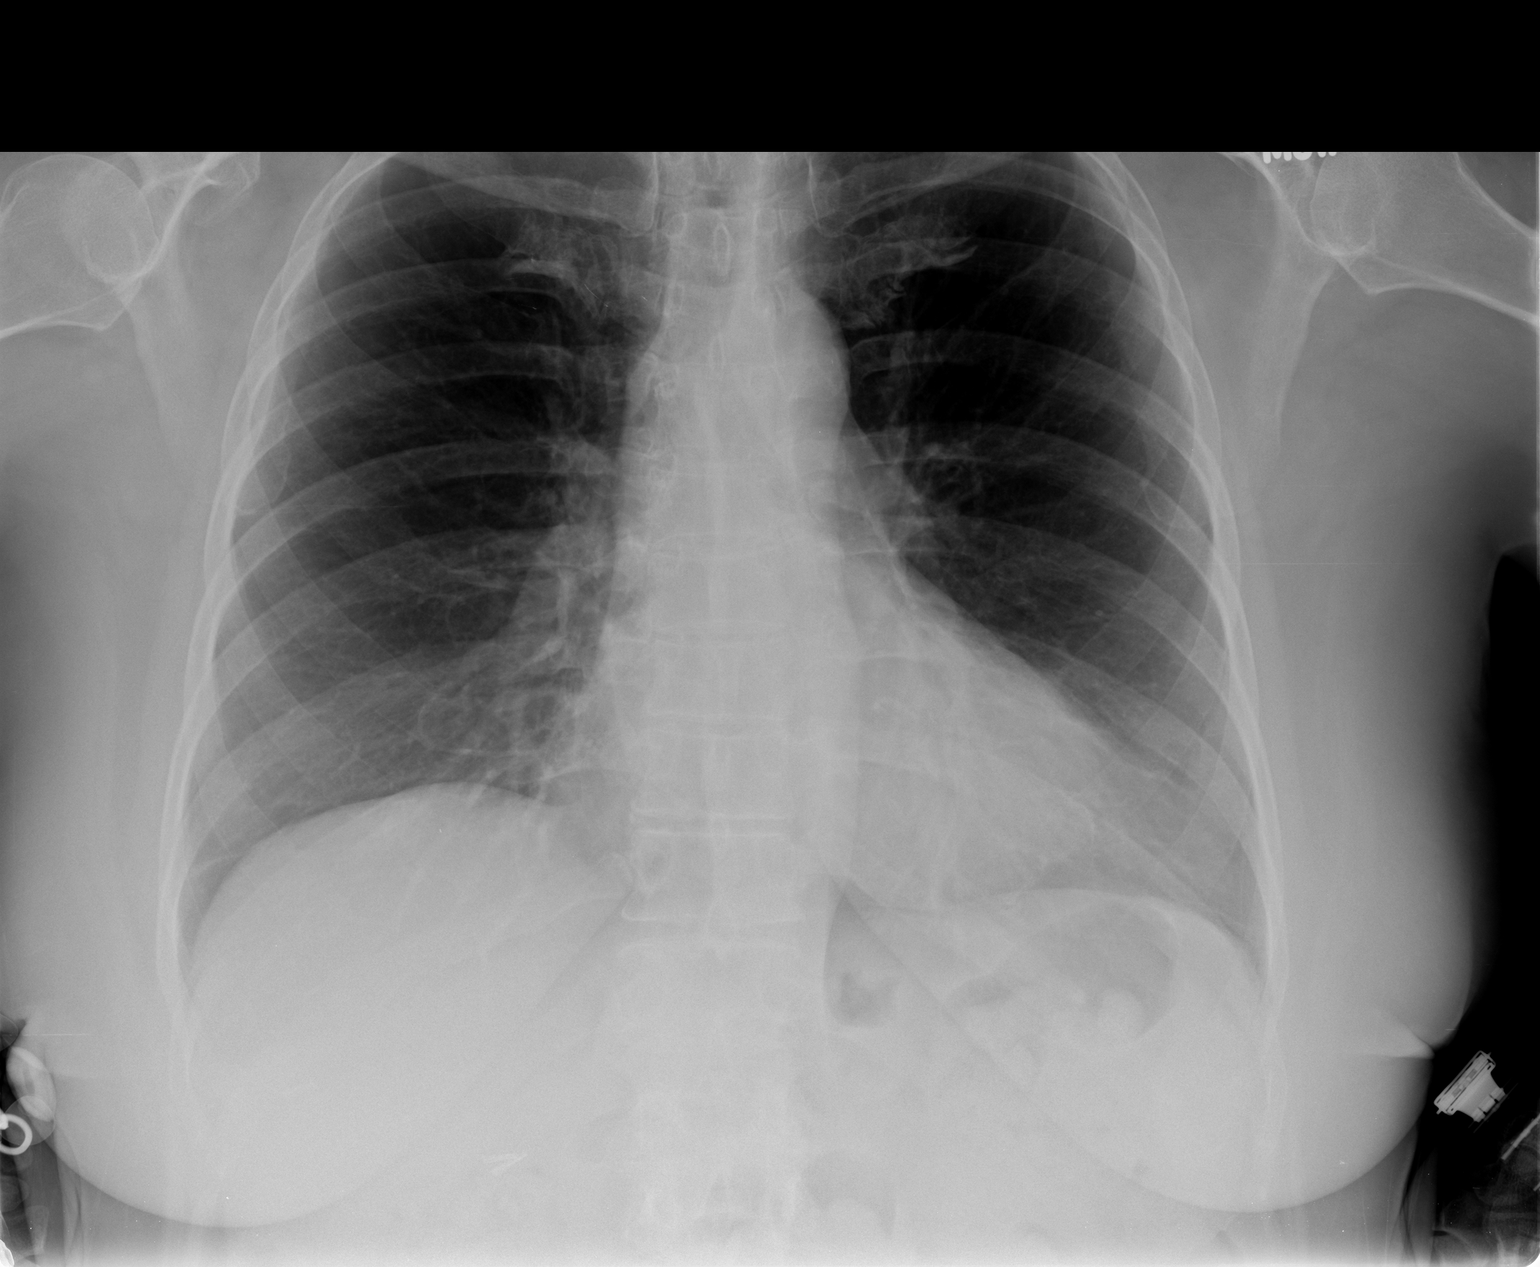

[view not recorded (2 of 2)]
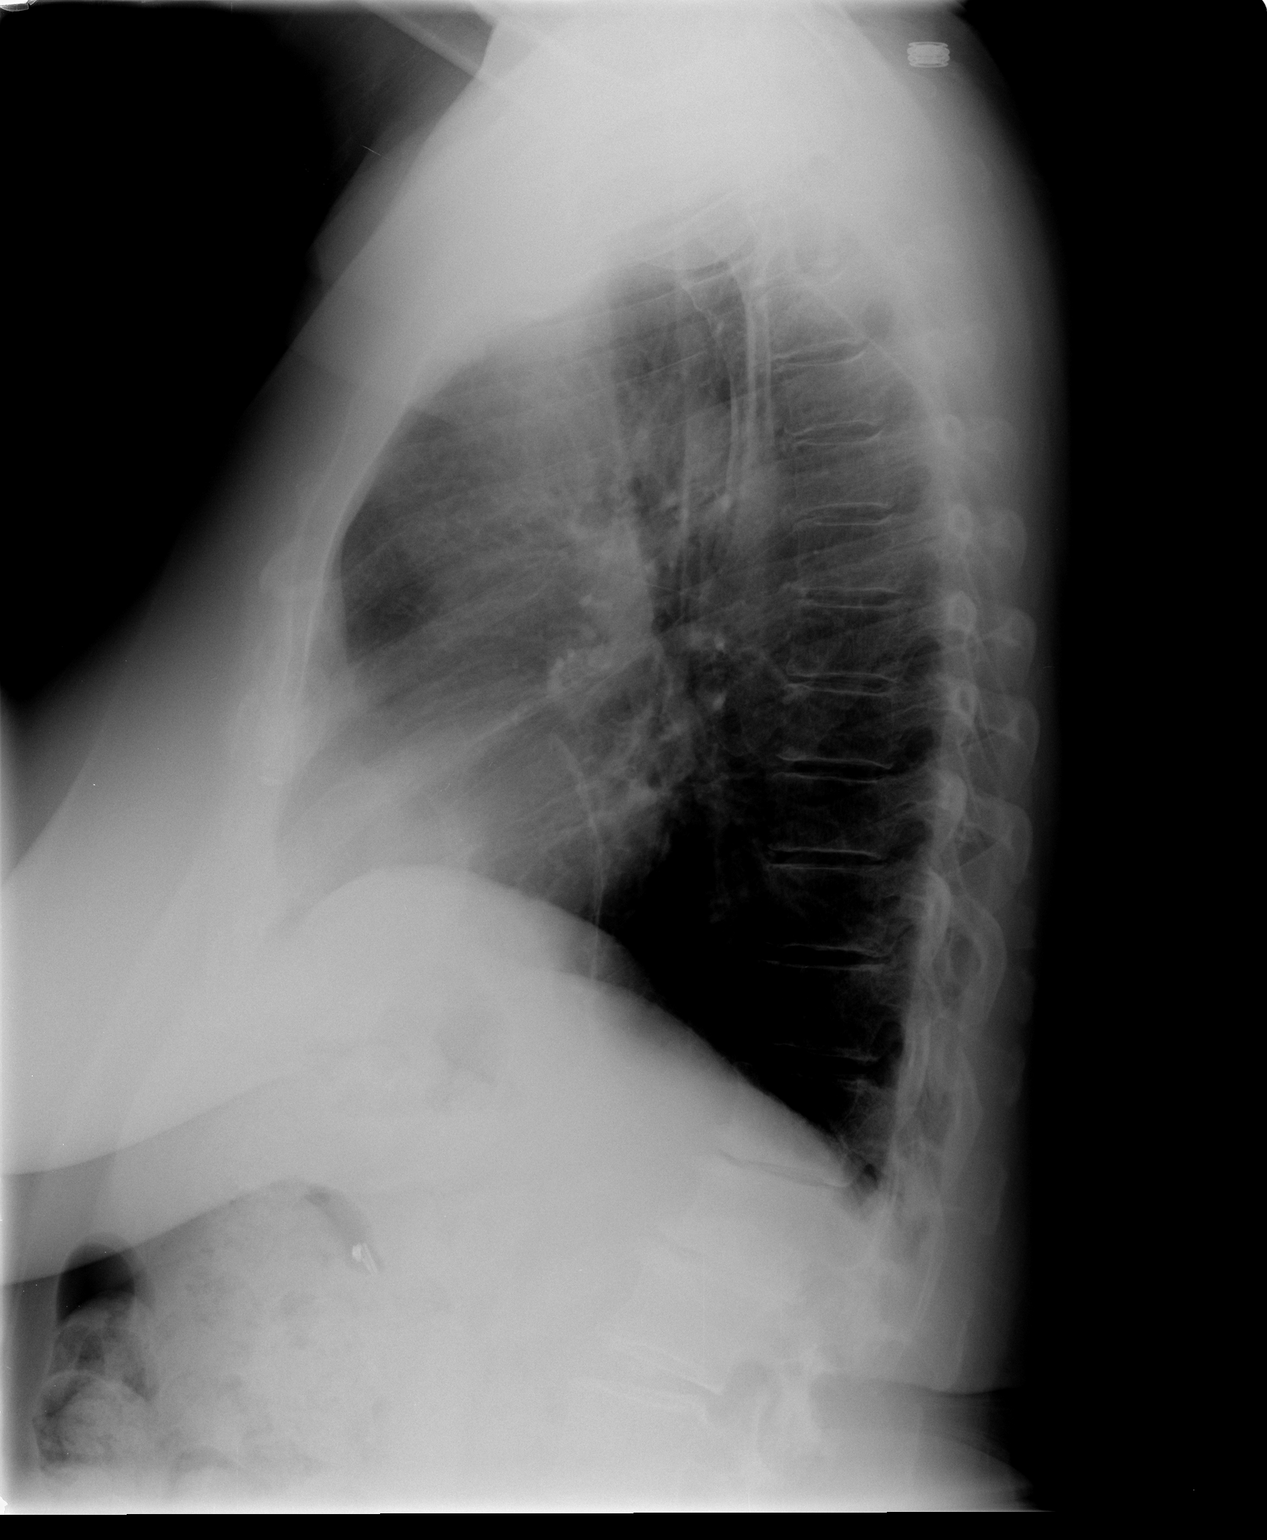

[2 of 2 positions shown; findings below may reference images not displayed]

FINDINGS: The heart size and pulmonary vascularity are normal and
the lungs are clear except for slight linear scarring at the bases
on the lateral view.  No significant osseous abnormality.
IMPRESSION: No acute disease in the chest.

## 2015-03-11 ENCOUNTER — Ambulatory Visit (INDEPENDENT_AMBULATORY_CARE_PROVIDER_SITE_OTHER): Payer: Medicare Other | Admitting: Internal Medicine

## 2015-03-11 VITALS — BP 120/60 | HR 71 | Temp 98.0°F | Resp 20 | Ht 62.0 in | Wt 181.8 lb

## 2015-03-11 DIAGNOSIS — J0101 Acute recurrent maxillary sinusitis: Secondary | ICD-10-CM | POA: Diagnosis not present

## 2015-03-11 MED ORDER — LEVOFLOXACIN 500 MG PO TABS: 500.0000 mg | ORAL_TABLET | Freq: Every day | ORAL | Status: AC

## 2015-03-11 NOTE — Progress Notes (Signed)
Subjective:  This chart was scribed for Sherri Siaobert Doolittle, MD by Sherri Graves, ED Scribe. This patient was seen in room 2 and the patient's care was started at 11:47 AM.   Patient ID: Sherri Graves, female    DOB: [birth year], 69 y.o.   MRN: [id number]  HPI Chief Complaint  Patient presents with  . Sinusitis    ear pain x 2 days  . Sore Throat  . Cough  1st [facility] OV from OOT  HPI Comments: Sherri Graves is a 69 y.o. female who presents to the [facility] complaining of a possible sinus infection. Pt states symptoms started 2 days ago with sinus drainage and HA that persisted to cough and sore throat. She is being seen here today along with her husband who is being seen for similar symptoms. Her last sinus infection was last spring. She usually takes Levaquin, without adverse effects. She report sick contacts including her grandson. She denies fever.  Past Medical History  Diagnosis Date  . Spinal headache     ?"migraine" after surgery attributed to morphine; if given IV steroid, toradol and nausea med this helps  . PONV (postoperative nausea and vomiting)   . MVP (mitral valve prolapse)   . Hypertension   . GERD (gastroesophageal reflux disease)   . H/O hiatal hernia   . Fibromyalgia   . Arthritis     rheumatoid  . Hypercholesterolemia    Prior to Admission medications   Medication Sig Start Date End Date Taking? Authorizing Provider  aspirin EC 81 MG tablet Take 81 mg by mouth at bedtime.   Yes Historical Provider, MD  baclofen (LIORESAL) 10 MG tablet Take 10 mg by mouth 3 (three) times daily as needed. For migraine   Yes Historical Provider, MD  Calcium Carbonate-Vitamin D (CALCIUM 600 + D PO) Take 2 tablets by mouth daily.   Yes Historical Provider, MD  Cholecalciferol (VITAMIN D) 2000 UNITS tablet Take 2,000 Units by mouth 2 (two) times daily.   Yes Historical Provider, MD  diclofenac (FLECTOR) 1.3 % PTCH Place 1 patch onto the skin daily as  needed. For pain   Yes Historical Provider, MD  estradiol (ESTRACE) 1 MG tablet Take 1 mg by mouth daily.   Yes Historical Provider, MD  gabapentin (NEURONTIN) 300 MG capsule Take 600 mg by mouth at bedtime.   Yes Historical Provider, MD  hydrochlorothiazide (MICROZIDE) 12.5 MG capsule Take 12.5 mg by mouth every morning.   Yes Historical Provider, MD  L-Methylfolate (DEPLIN) 15 MG TABS Take 15 mg by mouth every morning.   Yes Historical Provider, MD  Lactobacillus (ACIDOPHILUS PO) Take 1 capsule by mouth every morning.   Yes Historical Provider, MD  Lysine 500 MG CAPS Take 500 mg by mouth 2 (two) times daily.   Yes Historical Provider, MD  Milnacipran HCl (SAVELLA) 25 MG TABS Take 25 mg by mouth 2 (two) times daily.   Yes Historical Provider, MD  montelukast (SINGULAIR) 10 MG tablet Take 10 mg by mouth at bedtime.   Yes Historical Provider, MD  nebivolol (BYSTOLIC) 10 MG tablet Take 10 mg by mouth daily.   Yes Historical Provider, MD  pantoprazole (PROTONIX) 40 MG tablet Take 40 mg by mouth every morning.   Yes Historical Provider, MD  rosuvastatin (CRESTOR) 10 MG tablet Take 10 mg by mouth every morning.   Yes Historical Provider, MD  topiramate (TOPAMAX) 25 MG tablet Take 75 mg by mouth at bedtime.  Yes Historical Provider, MD  traZODone (DESYREL) 150 MG tablet Take 150 mg by mouth at bedtime.   Yes Historical Provider, MD  Vilazodone HCl (VIIBRYD) 40 MG TABS Take 40 mg by mouth every morning.    Yes Historical Provider, MD    Review of Systems  Constitutional: Negative for fever and chills.  HENT: Positive for sinus pressure and sore throat.   Respiratory: Positive for cough.   Neurological: Positive for headaches.       Objective:   Physical Exam  Constitutional: She is oriented to person, place, and time. She appears well-developed and well-nourished. No distress.  HENT:  Head: Normocephalic and atraumatic.  Purulent discharge  Eyes: Conjunctivae and EOM are normal.  Neck: Neck  supple.  Cardiovascular: Normal rate.   Pulmonary/Chest: Effort normal.  Musculoskeletal: Normal range of motion.  Neurological: She is alert and oriented to person, place, and time.  Skin: Skin is warm and dry.  Psychiatric: She has a normal mood and affect. Her behavior is normal.  Nursing note and vitals reviewed.  Filed Vitals:   03/11/15 1119  BP: 120/60  Pulse: 71  Temp: 98 F (36.7 C)  TempSrc: Oral  Resp: 20  Height:  (1.575 m)  Weight: 181 lb 12.8 oz (82.464 kg)  SpO2: 95%    Assessment & Plan:  Acute recurrent maxillary sinusitis  otc meds ok Meds ordered this encounter  Medications  . levofloxacin (LEVAQUIN) 500 MG tablet    Sig: Take 1 tablet (500 mg total) by mouth daily.    Dispense:  7 tablet    Refill:  0   By signing my name below, I, Sherri Graves, attest that this documentation has been prepared under the direction and in the presence of Sherri Sia, MD.  Electronically Signed: Andrew Graves, ED Scribe. 03/11/2015. 11:51 AM.  I have completed the patient encounter in its entirety as documented by the scribe, with editing by me where necessary. Sherri Graves, M.D.
